# Patient Record
Sex: Female | Born: 1960
Health system: Southern US, Community
[De-identification: ages and names within clinical notes are randomized; demographics above are authoritative.]

## PROBLEM LIST (undated history)

## (undated) DIAGNOSIS — N3281 Overactive bladder: Secondary | ICD-10-CM

## (undated) DIAGNOSIS — J45909 Unspecified asthma, uncomplicated: Secondary | ICD-10-CM

## (undated) HISTORY — PX: CERVICAL DISC SURGERY: SHX588

## (undated) HISTORY — PX: BREAST BIOPSY: SHX20

## (undated) HISTORY — PX: CHOLECYSTECTOMY: SHX55

---

## 1998-11-18 ENCOUNTER — Encounter: Payer: Self-pay | Admitting: Family Medicine

## 1998-11-18 ENCOUNTER — Ambulatory Visit (HOSPITAL_COMMUNITY): Admission: RE | Admit: 1998-11-18 | Discharge: 1998-11-18 | Payer: Self-pay | Admitting: Family Medicine

## 2000-04-07 ENCOUNTER — Other Ambulatory Visit: Admission: RE | Admit: 2000-04-07 | Discharge: 2000-04-07 | Payer: Self-pay | Admitting: Obstetrics & Gynecology

## 2002-01-02 ENCOUNTER — Other Ambulatory Visit: Admission: RE | Admit: 2002-01-02 | Discharge: 2002-01-02 | Payer: Self-pay | Admitting: Obstetrics and Gynecology

## 2002-02-12 ENCOUNTER — Encounter: Payer: Self-pay | Admitting: Obstetrics and Gynecology

## 2002-02-12 ENCOUNTER — Inpatient Hospital Stay (HOSPITAL_COMMUNITY): Admission: AD | Admit: 2002-02-12 | Discharge: 2002-02-12 | Payer: Self-pay | Admitting: *Deleted

## 2002-02-13 ENCOUNTER — Emergency Department (HOSPITAL_COMMUNITY): Admission: EM | Admit: 2002-02-13 | Discharge: 2002-02-13 | Payer: Self-pay | Admitting: Emergency Medicine

## 2010-03-24 ENCOUNTER — Encounter: Admission: RE | Admit: 2010-03-24 | Discharge: 2010-03-24 | Payer: Self-pay | Admitting: Sports Medicine

## 2010-04-09 ENCOUNTER — Encounter: Admission: RE | Admit: 2010-04-09 | Discharge: 2010-04-09 | Payer: Self-pay | Admitting: Sports Medicine

## 2010-04-20 ENCOUNTER — Ambulatory Visit (HOSPITAL_COMMUNITY): Admission: RE | Admit: 2010-04-20 | Discharge: 2010-04-20 | Payer: Self-pay | Admitting: Neurosurgery

## 2010-05-18 ENCOUNTER — Encounter: Admission: RE | Admit: 2010-05-18 | Discharge: 2010-05-18 | Payer: Self-pay | Admitting: Neurosurgery

## 2010-06-16 ENCOUNTER — Encounter: Admission: RE | Admit: 2010-06-16 | Discharge: 2010-06-16 | Payer: Self-pay | Admitting: Neurosurgery

## 2010-07-16 ENCOUNTER — Encounter: Admission: RE | Admit: 2010-07-16 | Discharge: 2010-07-16 | Payer: Self-pay | Admitting: Neurosurgery

## 2010-08-22 ENCOUNTER — Encounter: Admission: RE | Admit: 2010-08-22 | Discharge: 2010-08-22 | Payer: Self-pay | Admitting: Neurosurgery

## 2010-10-31 ENCOUNTER — Encounter: Payer: Self-pay | Admitting: Neurosurgery

## 2010-12-27 LAB — BASIC METABOLIC PANEL
GFR calc non Af Amer: >60 mL/min (ref >60)
Glucose, Bld: 113 mg/dL — ABNORMAL HIGH (ref 70–99)
Potassium: 4.1 mEq/L (ref 3.5–5.1)
Sodium: 137 mEq/L (ref 135–145)

## 2010-12-27 LAB — CBC
Hemoglobin: 12.8 g/dL (ref 12.0–15.0)
MCV: 94.4 fL (ref 78.0–100.0)
Platelets: 240 10*3/uL (ref 150–400)
RBC: 3.97 MIL/uL (ref 3.87–5.11)
WBC: 9.6 10*3/uL (ref 4.0–10.5)

## 2010-12-27 LAB — SURGICAL PCR SCREEN: MRSA, PCR: NEGATIVE

## 2011-04-10 IMAGING — CR DG CHEST 2V
2 series · 2 of 2 positions shown · non-contrast
Comparison: None

CLINICAL DATA: Disc herniation, preoperative assessment, history of
asthma, pneumonia, bronchitis, wheezing

CHEST - 2 VIEW

[view not recorded (1 of 2)]
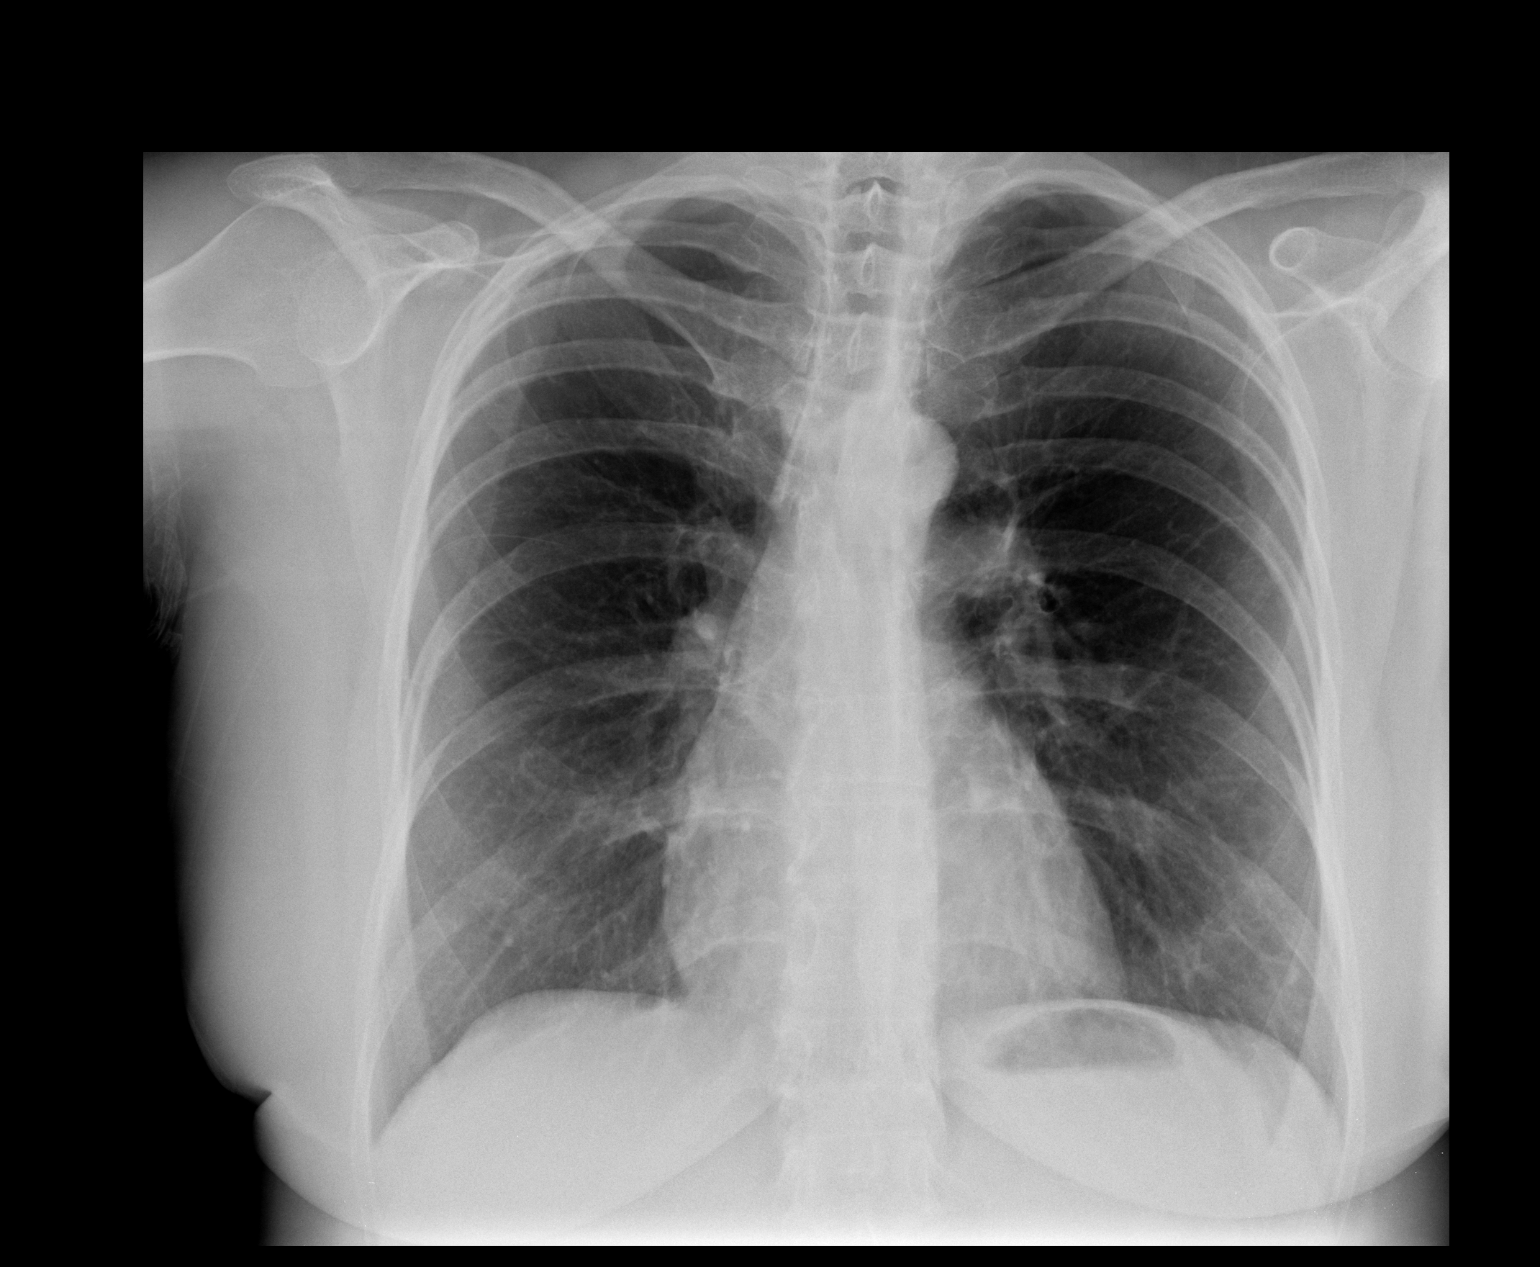

[view not recorded (2 of 2)]
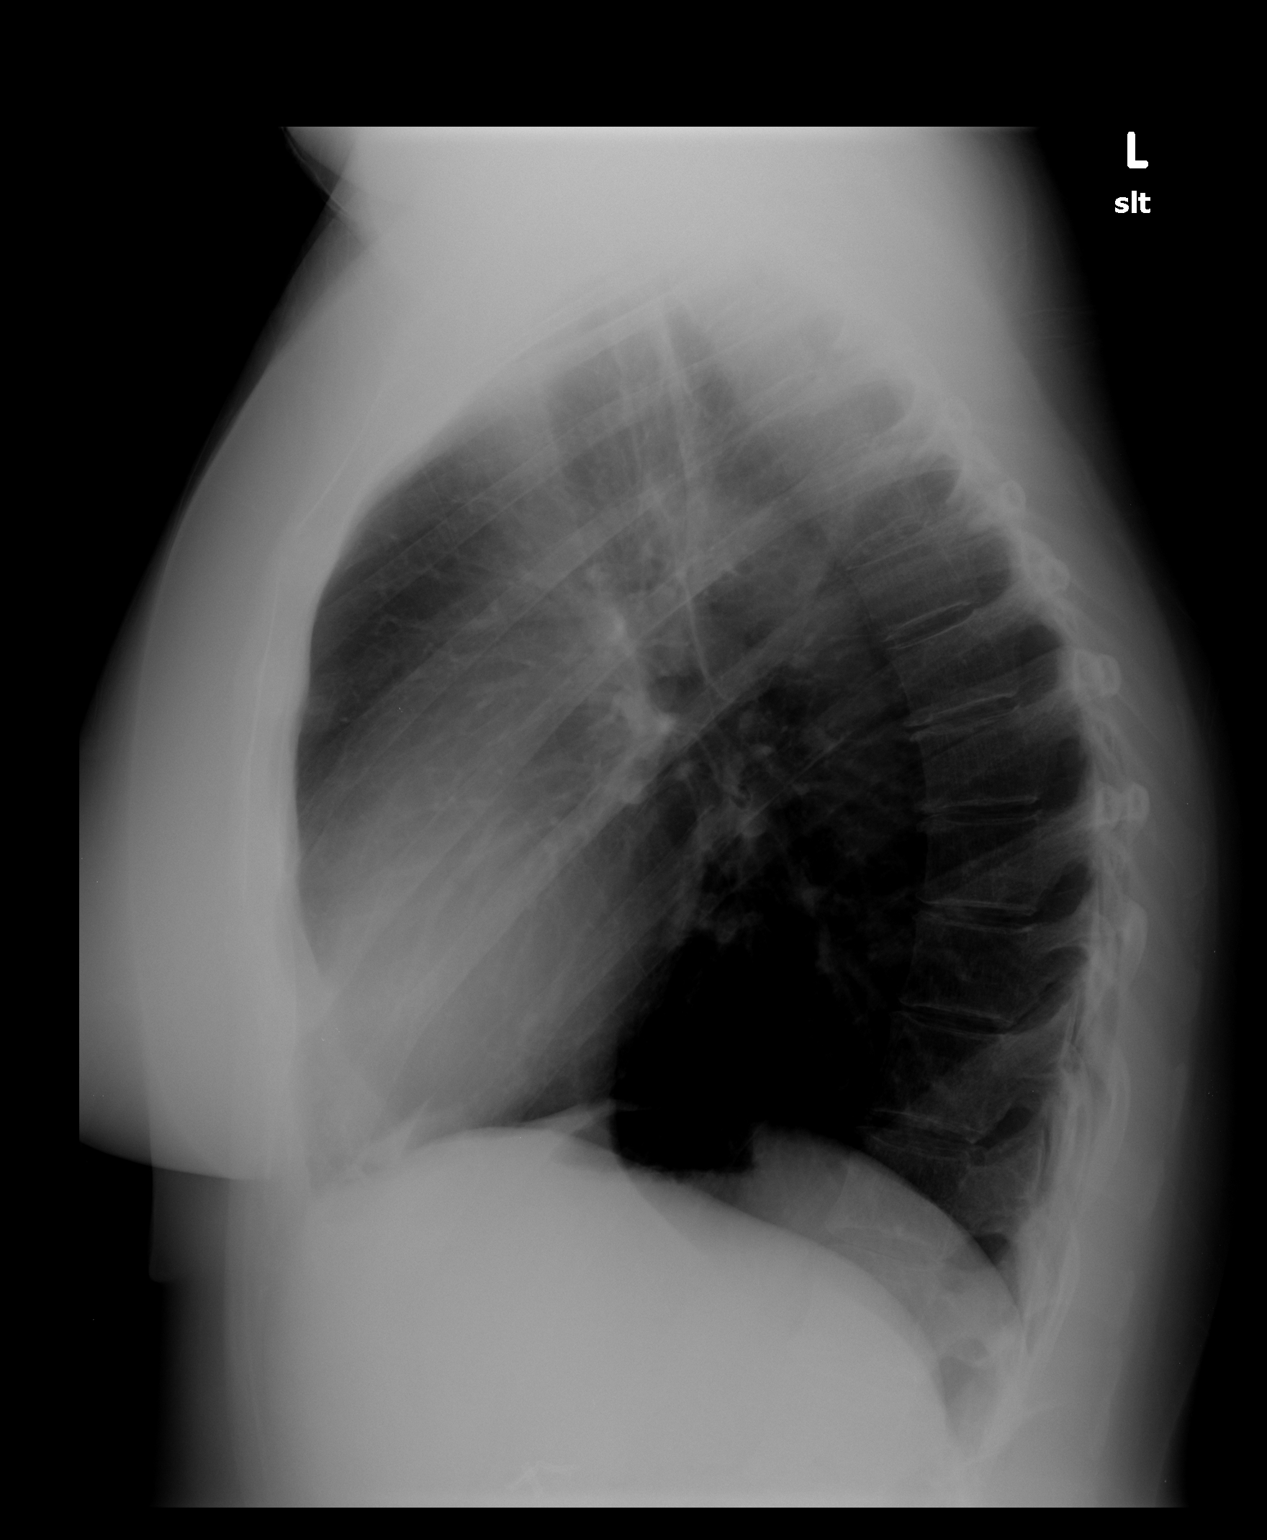

[2 of 2 positions shown; findings below may reference images not displayed]

FINDINGS: Normal heart size, mediastinal contours, and pulmonary vascularity.
Peribronchial thickening without infiltrate or effusion.
No pneumothorax.
Lungs appear mildly hyperexpanded.
Bones unremarkable.
IMPRESSION: Hyperexpanded lungs with peribronchial thickening, can be seen with
asthma or bronchitis.
No acute infiltrate identified.

## 2011-04-15 IMAGING — RF DG CERVICAL SPINE 1V
1 series · 2 of 2 positions shown · non-contrast
Comparison: None.

CLINICAL DATA: C5-7 anterior cervical fusion.

CERVICAL SPINE - 1 VIEW

[Series 1: run · 2 of 2 slices shown]
[im 1/2]
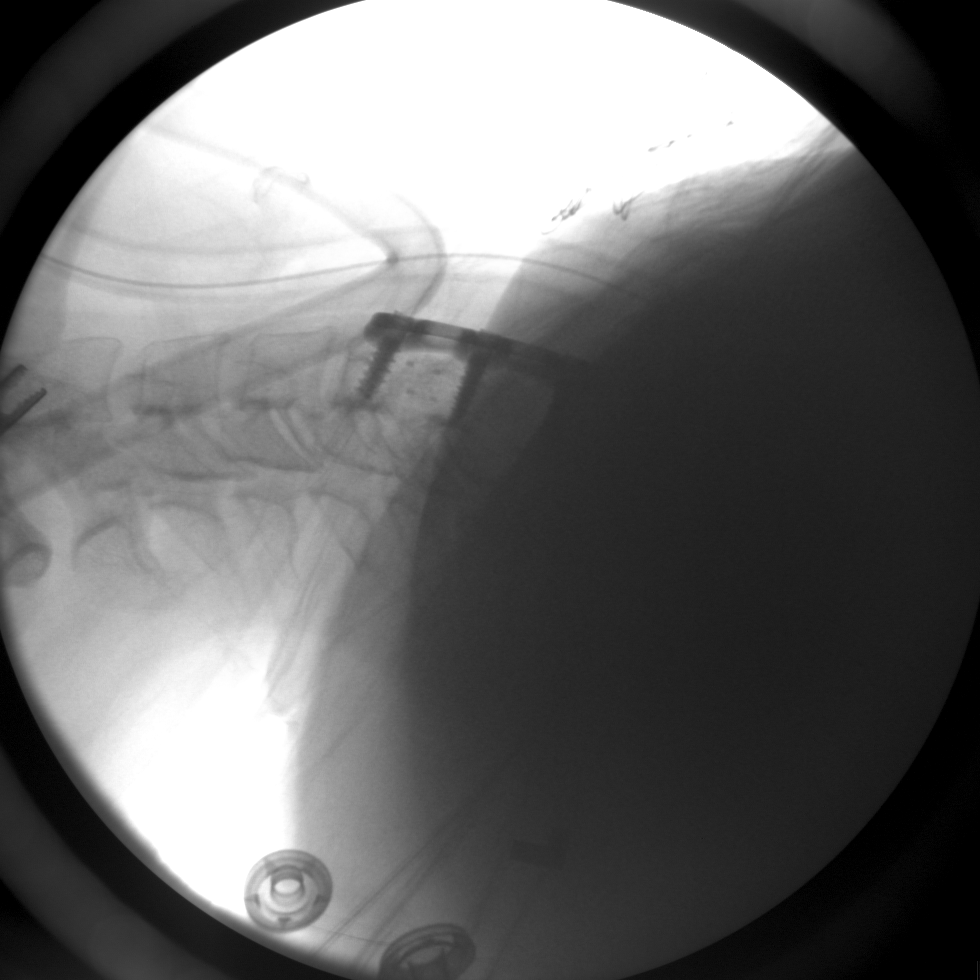
[im 2/2]
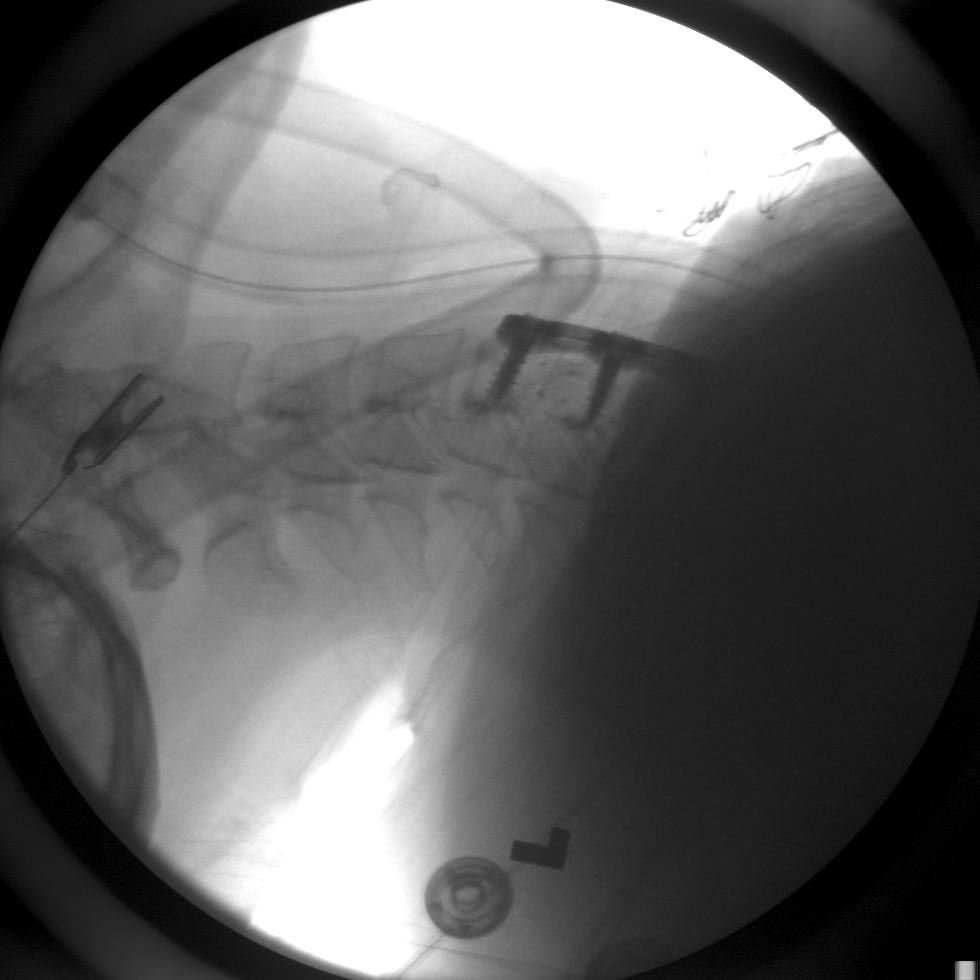

[2 of 2 positions shown; findings below may reference images not displayed]

FINDINGS: Two intraoperative fluoroscopic spot views of the
cervical spine are submitted in the lateral projection.  There has
been C5-7 anterior cervical fusion with interbody spacers.  Osseous
detail is degraded by technique.  Lower portion of the fusion is
obscured by the patient's shoulders.
IMPRESSION: Intraoperative visualization of C5-7 anterior cervical fusion.

## 2014-09-22 ENCOUNTER — Emergency Department (HOSPITAL_COMMUNITY)
Admission: EM | Admit: 2014-09-22 | Discharge: 2014-09-22 | Disposition: A | Discharge status: Home / Self Care | Payer: 59 | Source: Home / Self Care | Attending: Family Medicine | Admitting: Family Medicine

## 2014-09-22 ENCOUNTER — Encounter (HOSPITAL_COMMUNITY): Payer: Self-pay | Admitting: *Deleted

## 2014-09-22 ENCOUNTER — Ambulatory Visit (HOSPITAL_COMMUNITY): Payer: 59 | Attending: Emergency Medicine

## 2014-09-22 DIAGNOSIS — R079 Chest pain, unspecified: Secondary | ICD-10-CM | POA: Insufficient documentation

## 2014-09-22 DIAGNOSIS — J069 Acute upper respiratory infection, unspecified: Secondary | ICD-10-CM

## 2014-09-22 DIAGNOSIS — R05 Cough: Secondary | ICD-10-CM | POA: Insufficient documentation

## 2014-09-22 HISTORY — DX: Overactive bladder: N32.81

## 2014-09-22 HISTORY — DX: Unspecified asthma, uncomplicated: J45.909

## 2014-09-22 MED ORDER — ALBUTEROL SULFATE HFA 108 (90 BASE) MCG/ACT IN AERS: 2.0000 | INHALATION_SPRAY | RESPIRATORY_TRACT | Status: AC | PRN

## 2014-09-22 MED ORDER — FLUTICASONE PROPIONATE 50 MCG/ACT NA SUSP: 2.0000 | Freq: Two times a day (BID) | NASAL | Status: AC

## 2014-09-22 MED ORDER — PREDNISONE 10 MG PO TABS: ORAL_TABLET | ORAL | Status: AC

## 2014-09-22 MED ORDER — AZITHROMYCIN 250 MG PO TABS: ORAL_TABLET | ORAL | Status: AC

## 2014-09-22 NOTE — Discharge Instructions (Signed)
Upper Respiratory Infection, Adult An upper respiratory infection (URI) is also sometimes known as the common cold. The upper respiratory tract includes the nose, sinuses, throat, trachea, and bronchi. Bronchi are the airways leading to the lungs. Most people improve within 1 week, but symptoms can last up to 2 weeks. A residual cough may last even longer.  CAUSES Many different viruses can infect the tissues lining the upper respiratory tract. The tissues become irritated and inflamed and often become very moist. Mucus production is also common. A cold is contagious. You can easily spread the virus to others by oral contact. This includes kissing, sharing a glass, coughing, or sneezing. Touching your mouth or nose and then touching a surface, which is then touched by another person, can also spread the virus. SYMPTOMS  Symptoms typically develop 1 to 3 days after you come in contact with a cold virus. Symptoms vary from person to person. They may include:  Runny nose.  Sneezing.  Nasal congestion.  Sinus irritation.  Sore throat.  Loss of voice (laryngitis).  Cough.  Fatigue.  Muscle aches.  Loss of appetite.  Headache.  Low-grade fever. DIAGNOSIS  You might diagnose your own cold based on familiar symptoms, since most people get a cold 2 to 3 times a year. Your caregiver can confirm this based on your exam. Most importantly, your caregiver can check that your symptoms are not due to another disease such as strep throat, sinusitis, pneumonia, asthma, or epiglottitis. Blood tests, throat tests, and X-rays are not necessary to diagnose a common cold, but they may sometimes be helpful in excluding other more serious diseases. Your caregiver will decide if any further tests are required. RISKS AND COMPLICATIONS  You may be at risk for a more severe case of the common cold if you smoke cigarettes, have chronic heart disease (such as heart failure) or lung disease (such as asthma), or if  you have a weakened immune system. The very young and very old are also at risk for more serious infections. Bacterial sinusitis, middle ear infections, and bacterial pneumonia can complicate the common cold. The common cold can worsen asthma and chronic obstructive pulmonary disease (COPD). Sometimes, these complications can require emergency medical care and may be life-threatening. PREVENTION  The best way to protect against getting a cold is to practice good hygiene. Avoid oral or hand contact with people with cold symptoms. Wash your hands often if contact occurs. There is no clear evidence that vitamin C, vitamin E, echinacea, or exercise reduces the chance of developing a cold. However, it is always recommended to get plenty of rest and practice good nutrition. TREATMENT  Treatment is directed at relieving symptoms. There is no cure. Antibiotics are not effective, because the infection is caused by a virus, not by bacteria. Treatment may include:  Increased fluid intake. Sports drinks offer valuable electrolytes, sugars, and fluids.  Breathing heated mist or steam (vaporizer or shower).  Eating chicken soup or other clear broths, and maintaining good nutrition.  Getting plenty of rest.  Using gargles or lozenges for comfort.  Controlling fevers with ibuprofen or acetaminophen as directed by your caregiver.  Increasing usage of your inhaler if you have asthma. Zinc gel and zinc lozenges, taken in the first 24 hours of the common cold, can shorten the duration and lessen the severity of symptoms. Pain medicines may help with fever, muscle aches, and throat pain. A variety of non-prescription medicines are available to treat congestion and runny nose. Your caregiver   can make recommendations and may suggest nasal or lung inhalers for other symptoms.  HOME CARE INSTRUCTIONS   Only take over-the-counter or prescription medicines for pain, discomfort, or fever as directed by your  caregiver.  Use a warm mist humidifier or inhale steam from a shower to increase air moisture. This may keep secretions moist and make it easier to breathe.  Drink enough water and fluids to keep your urine clear or pale yellow.  Rest as needed.  Return to work when your temperature has returned to normal or as your caregiver advises. You may need to stay home longer to avoid infecting others. You can also use a face mask and careful hand washing to prevent spread of the virus. SEEK MEDICAL CARE IF:   After the first few days, you feel you are getting worse rather than better.  You need your caregiver's advice about medicines to control symptoms.  You develop chills, worsening shortness of breath, or brown or red sputum. These may be signs of pneumonia.  You develop yellow or brown nasal discharge or pain in the face, especially when you bend forward. These may be signs of sinusitis.  You develop a fever, swollen neck glands, pain with swallowing, or white areas in the back of your throat. These may be signs of strep throat. SEEK IMMEDIATE MEDICAL CARE IF:   You have a fever.  You develop severe or persistent headache, ear pain, sinus pain, or chest pain.  You develop wheezing, a prolonged cough, cough up blood, or have a change in your usual mucus (if you have chronic lung disease).  You develop sore muscles or a stiff neck. Document Released: 03/23/2001 Document Revised: 12/20/2011 Document Reviewed: 01/02/2014 [employer] Patient Information 2015 [employer]. This information is not intended to replace advice given to you by your health care provider. Make sure you discuss any questions you have with your health care provider.  

## 2014-09-22 NOTE — ED Notes (Signed)
Started 2 days ago with clear nasal drainage/runny nose.  Now having productive yellow cough, bilat ear and head pressure, chest heaviness, difficulty sleeping due to SOB.  Has used Ventolin, Mucinex D, Robitussin, nasal spray, and Benadryl without relief.

## 2014-09-22 NOTE — ED Provider Notes (Signed)
CSN: [id number]     Arrival date & time 09/22/14  1003 History   None    Chief Complaint  Patient presents with  . Cough  . Shortness of Breath   (Consider location/radiation/quality/duration/timing/severity/associated sxs/prior Treatment) HPI        53 year old female presents complaining of cough, congestion, rhinorrhea, shortness of breath. She initially got sick about 6 weeks ago with a respiratory illness, she was given antibiotics for bronchitis. She got better except for the cough has persisted. 3 days ago her symptoms returned. She has bad nasal congestion, rhinorrhea, chest tightness with shortness of breath. She has sinus and ear pressure as well. She is also experiencing a lot of postnasal drainage. Additionally she admits to some subjective fever and chills. She denies chest pain, nausea, vomiting. No recent travel or sick contacts. She is taking over-the-counter medications with minimal relief. She has an albuterol inhaler but she does not think it is helping  Past Medical History  Diagnosis Date  . Asthma   . Overactive bladder    Past Surgical History  Procedure Laterality Date  . Cholecystectomy    . Cervical disc surgery     No family history on file. History  Substance Use Topics  . Smoking status: Never Smoker   . Smokeless tobacco: Not on file  . Alcohol Use: No   OB History    No data available     Review of Systems  Constitutional: Positive for fever, chills and fatigue.  HENT: Positive for congestion, ear pain, postnasal drip, rhinorrhea, sinus pressure and sore throat. Negative for sneezing.   Eyes: Negative for visual disturbance.  Respiratory: Positive for chest tightness, shortness of breath and wheezing.   Cardiovascular: Negative for chest pain.  Gastrointestinal: Negative for nausea, vomiting, abdominal pain and diarrhea.  Musculoskeletal: Negative for myalgias and arthralgias.  Skin: Negative for rash.  Neurological: Positive for headaches.  Negative for dizziness and light-headedness.  All other systems reviewed and are negative.   Allergies  Codeine and Sulfa antibiotics  Home Medications   Prior to Admission medications   Medication Sig Start Date End Date Taking? Authorizing Provider  Albuterol Sulfate (VENTOLIN HFA IN) Inhale into the lungs.   Yes Historical Provider, MD  montelukast (SINGULAIR) 10 MG tablet Take 10 mg by mouth at bedtime.   Yes Historical Provider, MD  oxybutynin (DITROPAN-XL) 10 MG 24 hr tablet Take 10 mg by mouth at bedtime.   Yes Historical Provider, MD  albuterol (PROVENTIL HFA;VENTOLIN HFA) 108 (90 BASE) MCG/ACT inhaler Inhale 2 puffs into the lungs every 4 (four) hours as needed for wheezing. 09/22/14   Graylon GoodZachary H Kseniya Grunden, PA-C  azithromycin (ZITHROMAX Z-PAK) 250 MG tablet Use as directed 09/22/14   Graylon GoodZachary H Javion Holmer, PA-C  fluticasone (FLONASE) 50 MCG/ACT nasal spray Place 2 sprays into both nostrils 2 (two) times daily. Decrease to 2 sprays/nostril daily after 5 days 09/22/14   Graylon GoodZachary H Shavonne Ambroise, PA-C  predniSONE (DELTASONE) 10 MG tablet 4 tabs PO QD for 4 days; 3 tabs PO QD for 3 days; 2 tabs PO QD for 2 days; 1 tab PO QD for 1 day 09/22/14   Graylon GoodZachary H Rupa Lagan, PA-C   BP 117/68 mmHg  Pulse 71  Temp(Src) 97.7 F (36.5 C) (Oral)  Resp 18  SpO2 97% Physical Exam  Constitutional: She is oriented to person, place, and time. Vital signs are normal. She appears well-developed and well-nourished. No distress.  HENT:  Head: Normocephalic and atraumatic.  Right Ear: Tympanic  membrane, external ear and ear canal normal.  Left Ear: Tympanic membrane, external ear and ear canal normal.  Nose: Rhinorrhea present. Right sinus exhibits no maxillary sinus tenderness and no frontal sinus tenderness. Left sinus exhibits no maxillary sinus tenderness and no frontal sinus tenderness.  Mouth/Throat: Uvula is midline, oropharynx is clear and moist and mucous membranes are normal. No oropharyngeal exudate or posterior  oropharyngeal erythema.  Eyes: Conjunctivae are normal. Right eye exhibits no discharge. Left eye exhibits no discharge.  Neck: Normal range of motion. Neck supple.  Cardiovascular: Normal rate, regular rhythm and normal heart sounds.   Pulmonary/Chest: Effort normal. No respiratory distress. She has no wheezes. She has rales (RUL).  Lymphadenopathy:    She has no cervical adenopathy.  Neurological: She is alert and oriented to person, place, and time. She has normal strength. Coordination normal.  Skin: Skin is warm and dry. No rash noted. She is not diaphoretic.  Psychiatric: She has a normal mood and affect. Judgment normal.  Nursing note and vitals reviewed.   ED Course  Procedures (including critical care time) Labs Review Labs Reviewed - No data to display  Imaging Review Dg Chest 2 View  09/22/2014   CLINICAL DATA:  Cough and congestion with midline chest pain and SOB. Hx asthma  EXAM: CHEST - 2 VIEW  COMPARISON:  CT 11/28/2012 and earlier studies  FINDINGS: Lungs are clear. Heart size and mediastinal contours are within normal limits. No effusion. Visualized skeletal structures are unremarkable. Cervical fixation hardware noted. Surgical clips right upper abdomen.  IMPRESSION: No acute cardiopulmonary disease.   Electronically Signed   By: Oley Balmaniel  Hassell M.D.   On: 09/22/2014 11:22     MDM   1. URI (upper respiratory infection)    CXR normal.  Treat with prednisone, albuterol, z pak, flonase.  F/U if no improvement in a few days   New Prescriptions   ALBUTEROL (PROVENTIL HFA;VENTOLIN HFA) 108 (90 BASE) MCG/ACT INHALER    Inhale 2 puffs into the lungs every 4 (four) hours as needed for wheezing.   AZITHROMYCIN (ZITHROMAX Z-PAK) 250 MG TABLET    Use as directed   FLUTICASONE (FLONASE) 50 MCG/ACT NASAL SPRAY    Place 2 sprays into both nostrils 2 (two) times daily. Decrease to 2 sprays/nostril daily after 5 days   PREDNISONE (DELTASONE) 10 MG TABLET    4 tabs PO QD for 4  days; 3 tabs PO QD for 3 days; 2 tabs PO QD for 2 days; 1 tab PO QD for 1 day       Graylon GoodZachary H Myshawn Chir[place], PA-C 09/22/14 1136

## 2015-10-23 ENCOUNTER — Other Ambulatory Visit: Payer: Self-pay

## 2015-10-23 DIAGNOSIS — Z1231 Encounter for screening mammogram for malignant neoplasm of breast: Secondary | ICD-10-CM

## 2015-11-14 ENCOUNTER — Ambulatory Visit: Payer: Self-pay

## 2015-11-24 ENCOUNTER — Ambulatory Visit
Admission: RE | Admit: 2015-11-24 | Discharge: 2015-11-24 | Disposition: A | Discharge status: Home / Self Care | Payer: 59 | Source: Ambulatory Visit

## 2015-11-24 DIAGNOSIS — Z1231 Encounter for screening mammogram for malignant neoplasm of breast: Secondary | ICD-10-CM

## 2015-11-26 ENCOUNTER — Other Ambulatory Visit: Payer: Self-pay | Admitting: Family Medicine

## 2015-11-26 DIAGNOSIS — R928 Other abnormal and inconclusive findings on diagnostic imaging of breast: Secondary | ICD-10-CM

## 2015-12-02 ENCOUNTER — Ambulatory Visit
Admission: RE | Admit: 2015-12-02 | Discharge: 2015-12-02 | Disposition: A | Discharge status: Home / Self Care | Payer: 59 | Source: Ambulatory Visit | Attending: Family Medicine | Admitting: Family Medicine

## 2015-12-02 ENCOUNTER — Other Ambulatory Visit: Payer: Self-pay | Admitting: Family Medicine

## 2015-12-02 DIAGNOSIS — R928 Other abnormal and inconclusive findings on diagnostic imaging of breast: Secondary | ICD-10-CM

## 2015-12-02 DIAGNOSIS — N6320 Unspecified lump in the left breast, unspecified quadrant: Secondary | ICD-10-CM

## 2015-12-08 ENCOUNTER — Other Ambulatory Visit: Payer: Self-pay | Admitting: Family Medicine

## 2015-12-08 DIAGNOSIS — N6320 Unspecified lump in the left breast, unspecified quadrant: Secondary | ICD-10-CM

## 2015-12-09 ENCOUNTER — Ambulatory Visit
Admission: RE | Admit: 2015-12-09 | Discharge: 2015-12-09 | Disposition: A | Discharge status: Home / Self Care | Payer: 59 | Source: Ambulatory Visit | Attending: Family Medicine | Admitting: Family Medicine

## 2015-12-09 DIAGNOSIS — N6320 Unspecified lump in the left breast, unspecified quadrant: Secondary | ICD-10-CM

## 2016-12-14 ENCOUNTER — Other Ambulatory Visit: Payer: Self-pay | Admitting: Family Medicine

## 2016-12-14 DIAGNOSIS — R5381 Other malaise: Secondary | ICD-10-CM

## 2017-01-07 ENCOUNTER — Other Ambulatory Visit: Payer: Self-pay | Admitting: Family Medicine

## 2017-01-07 DIAGNOSIS — N644 Mastodynia: Secondary | ICD-10-CM

## 2017-12-14 ENCOUNTER — Other Ambulatory Visit: Payer: Self-pay | Admitting: Family Medicine

## 2017-12-14 DIAGNOSIS — Z1231 Encounter for screening mammogram for malignant neoplasm of breast: Secondary | ICD-10-CM

## 2017-12-15 ENCOUNTER — Ambulatory Visit: Payer: 59

## 2021-10-28 ENCOUNTER — Other Ambulatory Visit: Payer: Self-pay | Admitting: Family Medicine

## 2021-10-28 DIAGNOSIS — Z1231 Encounter for screening mammogram for malignant neoplasm of breast: Secondary | ICD-10-CM

## 2021-12-15 ENCOUNTER — Ambulatory Visit
Admission: RE | Admit: 2021-12-15 | Discharge: 2021-12-15 | Disposition: A | Discharge status: Home / Self Care | Payer: 59 | Source: Ambulatory Visit | Attending: Family Medicine | Admitting: Family Medicine

## 2021-12-15 ENCOUNTER — Other Ambulatory Visit: Payer: Self-pay

## 2021-12-15 DIAGNOSIS — Z1231 Encounter for screening mammogram for malignant neoplasm of breast: Secondary | ICD-10-CM | POA: Diagnosis not present

## 2022-12-10 IMAGING — MG MM DIGITAL SCREENING BILAT W/ TOMO AND CAD
8 series · 8 of 24 positions shown · non-contrast
Comparison: Previous exam(s).

ACR Breast Density Category a: The breast tissue is almost entirely
fatty.

CLINICAL DATA: Screening.

EXAM:
DIGITAL SCREENING BILATERAL MAMMOGRAM WITH TOMOSYNTHESIS AND CAD
TECHNIQUE: Bilateral screening digital craniocaudal and mediolateral oblique
mammograms were obtained. Bilateral screening digital breast
tomosynthesis was performed. The images were evaluated with
computer-aided detection.

[L MLO synth-2D]
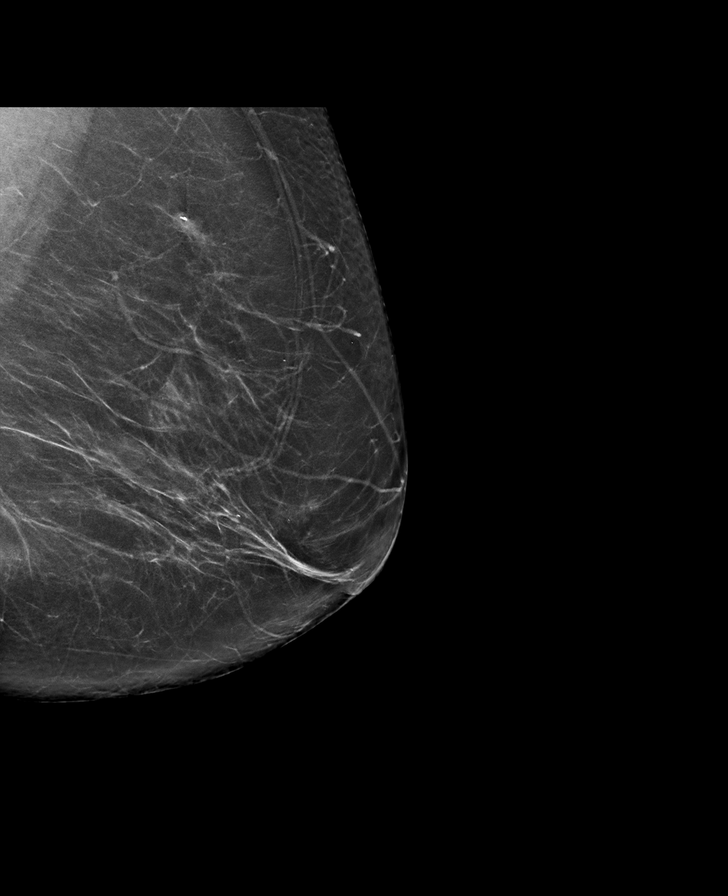

[R MLO synth-2D]
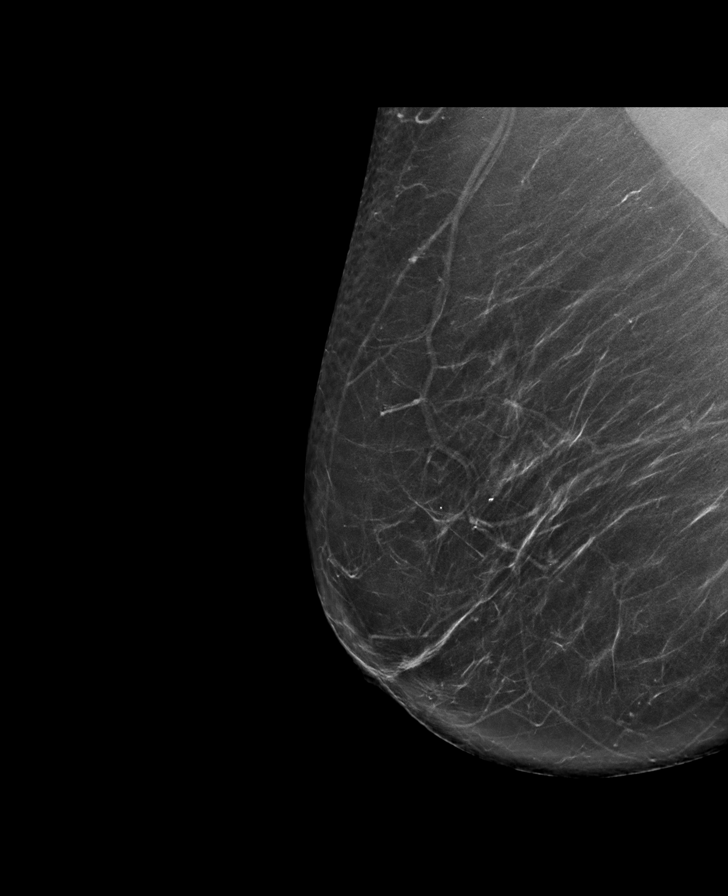

[R CC synth-2D]
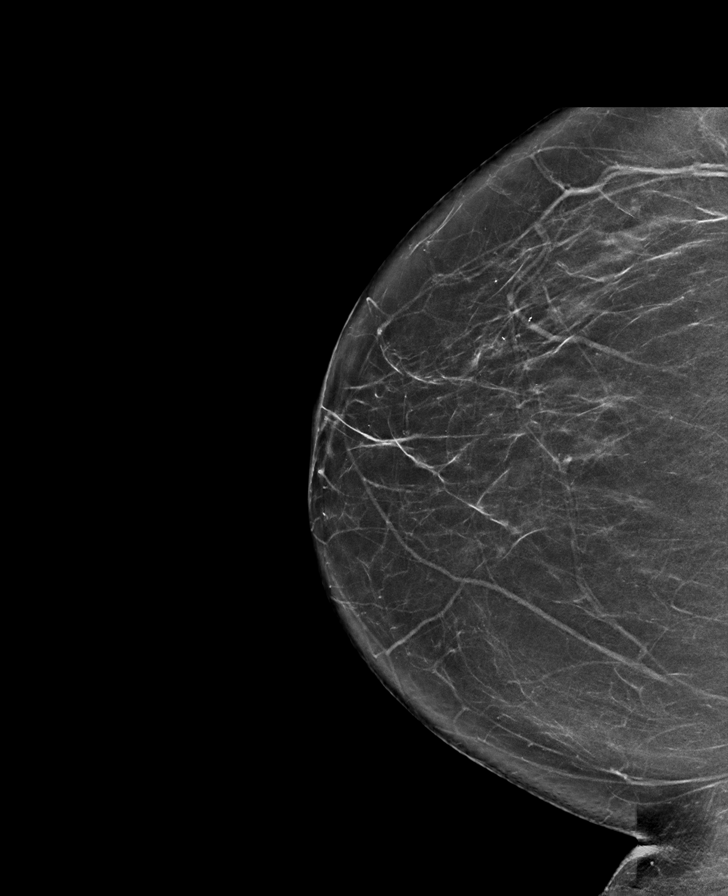

[L CC synth-2D]
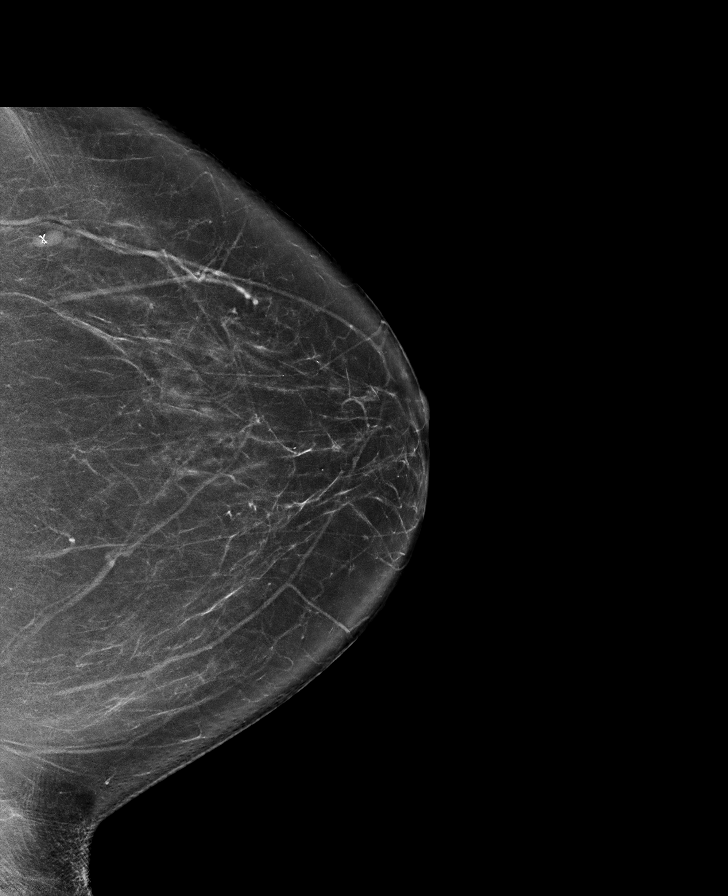

[L MLO tomo · tomo slice 40/79.0]
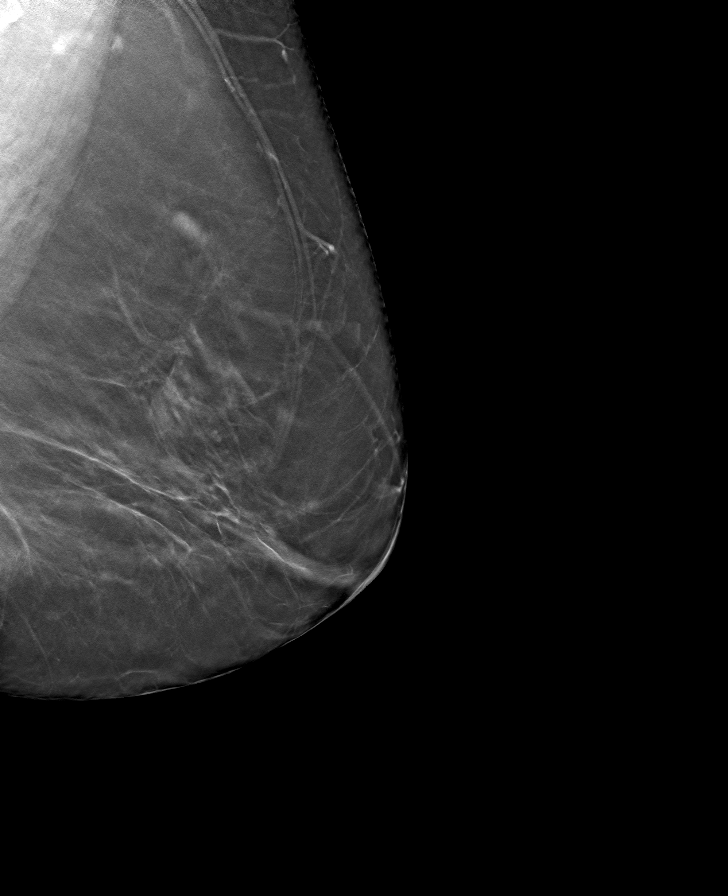

[R MLO tomo · tomo slice 41/81.0]
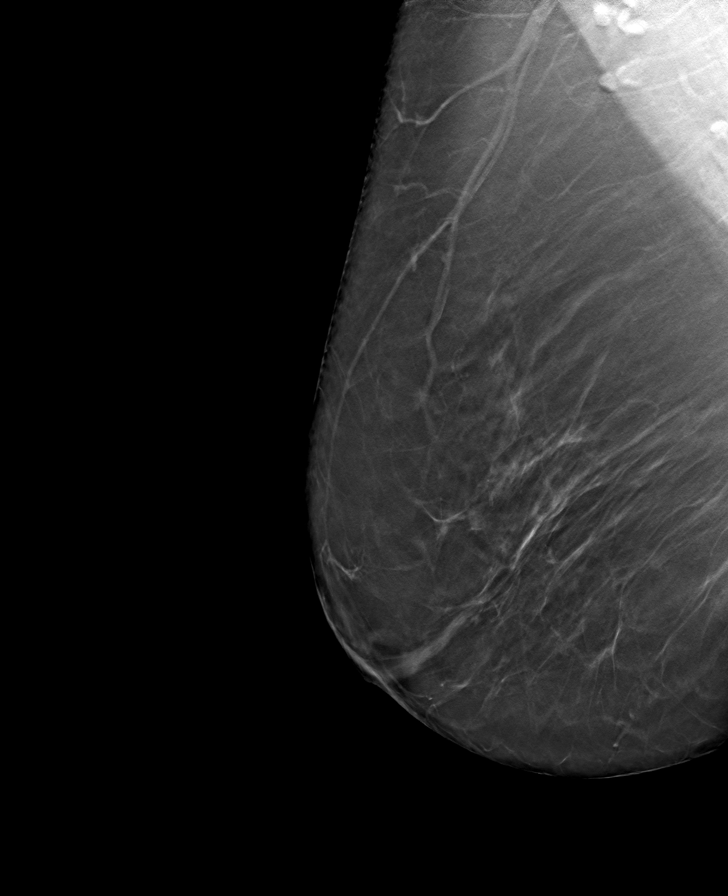

[L CC tomo · tomo slice 38/75.0]
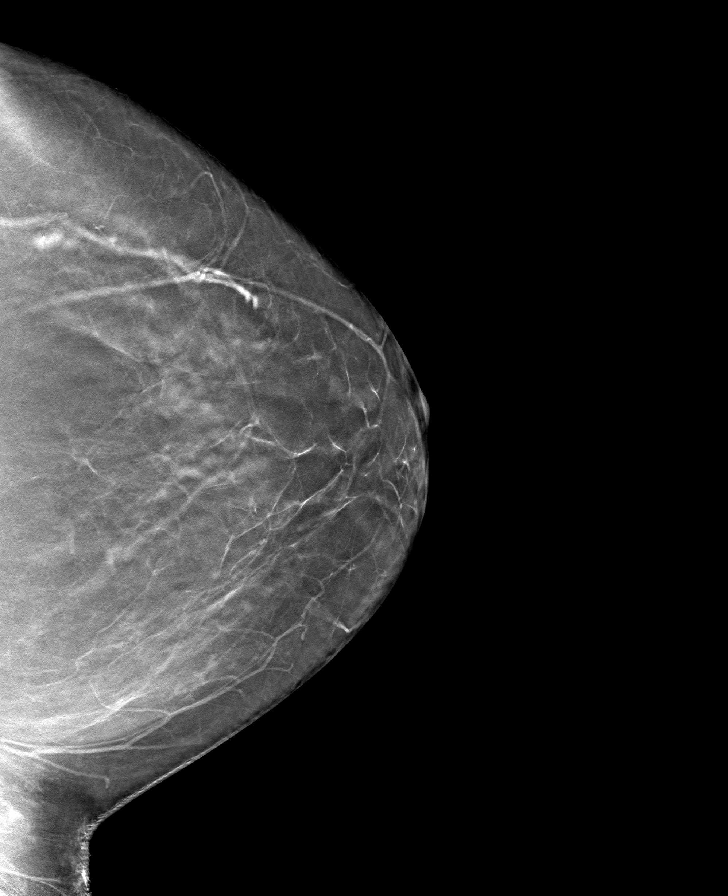

[R CC tomo · tomo slice 35/70.0]
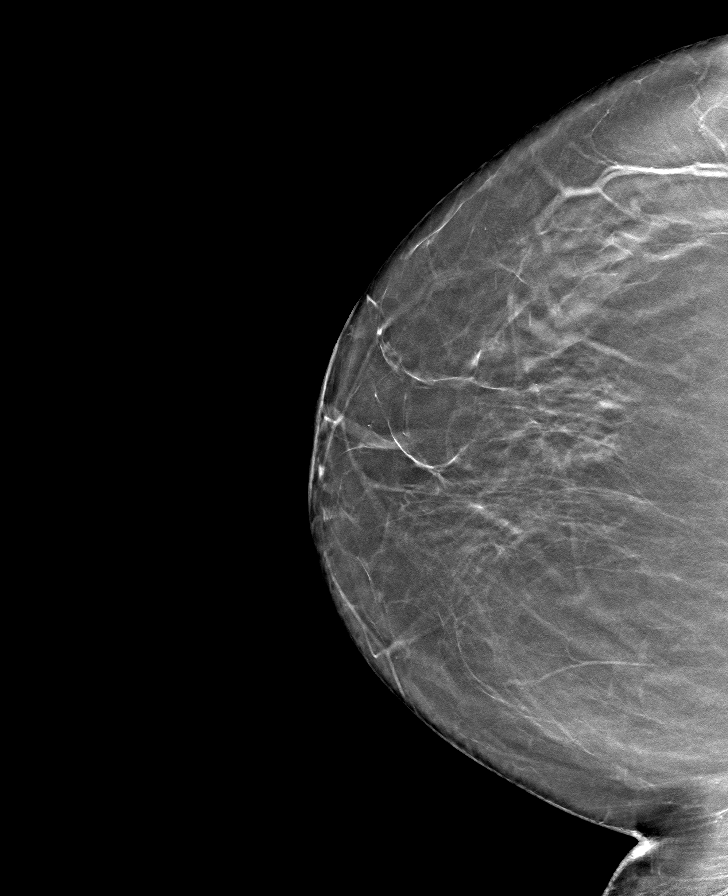

[8 of 24 positions shown; findings below may reference images not displayed]

FINDINGS: There are no findings suspicious for malignancy.
IMPRESSION: No mammographic evidence of malignancy. A result letter of this
screening mammogram will be mailed directly to the patient.

RECOMMENDATION:
Screening mammogram in one year. (Code:0E-3-N98)

BI-RADS CATEGORY  1: Negative.

## 2023-04-05 ENCOUNTER — Other Ambulatory Visit: Payer: Self-pay | Admitting: Family Medicine

## 2023-04-05 DIAGNOSIS — Z1231 Encounter for screening mammogram for malignant neoplasm of breast: Secondary | ICD-10-CM

## 2024-09-24 ENCOUNTER — Other Ambulatory Visit: Payer: Self-pay | Admitting: Family Medicine

## 2024-09-24 DIAGNOSIS — Z1231 Encounter for screening mammogram for malignant neoplasm of breast: Secondary | ICD-10-CM

## 2024-10-25 ENCOUNTER — Ambulatory Visit
Admission: RE | Admit: 2024-10-25 | Discharge: 2024-10-25 | Disposition: A | Discharge status: Home / Self Care | Source: Ambulatory Visit | Attending: Family Medicine | Admitting: Family Medicine

## 2024-10-25 DIAGNOSIS — Z1231 Encounter for screening mammogram for malignant neoplasm of breast: Secondary | ICD-10-CM | POA: Diagnosis present
